# Patient Record
Sex: Female | Born: 1950
Health system: Southern US, Community
[De-identification: ages and names within clinical notes are randomized; demographics above are authoritative.]

## PROBLEM LIST (undated history)

## (undated) DIAGNOSIS — J45909 Unspecified asthma, uncomplicated: Secondary | ICD-10-CM

## (undated) DIAGNOSIS — E7800 Pure hypercholesterolemia, unspecified: Secondary | ICD-10-CM

## (undated) HISTORY — PX: ABDOMINAL HYSTERECTOMY: SHX81

---

## 2015-01-14 DIAGNOSIS — J9801 Acute bronchospasm: Secondary | ICD-10-CM | POA: Diagnosis not present

## 2015-01-25 DIAGNOSIS — R05 Cough: Secondary | ICD-10-CM | POA: Diagnosis not present

## 2015-01-25 DIAGNOSIS — J4521 Mild intermittent asthma with (acute) exacerbation: Secondary | ICD-10-CM | POA: Diagnosis not present

## 2015-01-25 DIAGNOSIS — J310 Chronic rhinitis: Secondary | ICD-10-CM | POA: Diagnosis not present

## 2015-02-11 DIAGNOSIS — J9801 Acute bronchospasm: Secondary | ICD-10-CM | POA: Diagnosis not present

## 2015-02-15 DIAGNOSIS — J9801 Acute bronchospasm: Secondary | ICD-10-CM | POA: Diagnosis not present

## 2015-02-17 DIAGNOSIS — Z0000 Encounter for general adult medical examination without abnormal findings: Secondary | ICD-10-CM | POA: Diagnosis not present

## 2015-03-16 DIAGNOSIS — R509 Fever, unspecified: Secondary | ICD-10-CM | POA: Diagnosis not present

## 2015-03-16 DIAGNOSIS — R05 Cough: Secondary | ICD-10-CM | POA: Diagnosis not present

## 2015-03-16 DIAGNOSIS — J4520 Mild intermittent asthma, uncomplicated: Secondary | ICD-10-CM | POA: Diagnosis not present

## 2015-06-16 DIAGNOSIS — R739 Hyperglycemia, unspecified: Secondary | ICD-10-CM | POA: Diagnosis not present

## 2015-06-16 DIAGNOSIS — E785 Hyperlipidemia, unspecified: Secondary | ICD-10-CM | POA: Diagnosis not present

## 2015-07-01 ENCOUNTER — Encounter: Payer: Self-pay | Admitting: *Deleted

## 2015-07-01 ENCOUNTER — Emergency Department
Admission: EM | Admit: 2015-07-01 | Discharge: 2015-07-02 | Disposition: A | Discharge status: Home / Self Care | Payer: PPO | Attending: Emergency Medicine | Admitting: Emergency Medicine

## 2015-07-01 ENCOUNTER — Emergency Department: Payer: PPO

## 2015-07-01 DIAGNOSIS — R0602 Shortness of breath: Secondary | ICD-10-CM | POA: Diagnosis not present

## 2015-07-01 DIAGNOSIS — R9431 Abnormal electrocardiogram [ECG] [EKG]: Secondary | ICD-10-CM | POA: Diagnosis not present

## 2015-07-01 DIAGNOSIS — R062 Wheezing: Secondary | ICD-10-CM | POA: Insufficient documentation

## 2015-07-01 DIAGNOSIS — Z79899 Other long term (current) drug therapy: Secondary | ICD-10-CM | POA: Insufficient documentation

## 2015-07-01 HISTORY — DX: Pure hypercholesterolemia, unspecified: E78.00

## 2015-07-01 HISTORY — DX: Unspecified asthma, uncomplicated: J45.909

## 2015-07-01 MED ORDER — METHYLPREDNISOLONE SODIUM SUCC 125 MG IJ SOLR
125.0000 mg | Freq: Once | INTRAMUSCULAR | Status: AC
  Administered 2015-07-01: 125 mg via INTRAVENOUS
  Filled 2015-07-01: qty 2

## 2015-07-01 MED ORDER — IPRATROPIUM-ALBUTEROL 0.5-2.5 (3) MG/3ML IN SOLN
3.0000 mL | Freq: Once | RESPIRATORY_TRACT | Status: AC
  Administered 2015-07-01: 3 mL via RESPIRATORY_TRACT
  Filled 2015-07-01: qty 3

## 2015-07-01 MED ORDER — PREDNISONE 20 MG PO TABS: 40.0000 mg | ORAL_TABLET | Freq: Every day | ORAL | Status: AC

## 2015-07-01 NOTE — ED Notes (Signed)
Pt presents w/ dyspnea, audible inspiratory and expiratory wheezing, substernal retractions and persistent, non-productive cough. Pt last used neb x 3 hrs ago w/ small relief.

## 2015-07-01 NOTE — Discharge Instructions (Signed)
Please seek medical attention for any high fevers, chest pain, shortness of breath, change in behavior, persistent vomiting, bloody stool or any other new or concerning symptoms.  Bronchospasm, Adult A bronchospasm is when the tubes that carry air in and out of your lungs (airways) spasm or tighten. During a bronchospasm it is hard to breathe. This is because the airways get smaller. A bronchospasm can be triggered by:  Allergies. These may be to animals, pollen, food, or mold.  Infection. This is a common cause of bronchospasm.  Exercise.  Irritants. These include pollution, cigarette smoke, strong odors, aerosol sprays, and paint fumes.  Weather changes.  Stress.  Being emotional. HOME CARE   Always have a plan for getting help. Know when to call your doctor and local emergency services (911 in the [place] Know where you can get emergency care.  Only take medicines as told by your doctor.  If you were prescribed an inhaler or nebulizer machine, ask your doctor how to use it correctly. Always use a spacer with your inhaler if you were given one.  Stay calm during an attack. Try to relax and breathe more slowly.  Control your home environment:  Change your heating and air conditioning filter at least once a month.  Limit your use of fireplaces and wood stoves.  Do not  smoke. Do not  allow smoking in your home.  Avoid perfumes and fragrances.  Get rid of pests (such as roaches and mice) and their droppings.  Throw away plants if you see mold on them.  Keep your house clean and dust free.  Replace carpet with wood, tile, or vinyl flooring. Carpet can trap dander and dust.  Use allergy-proof pillows, mattress covers, and box spring covers.  Wash bed sheets and blankets every week in hot water. Dry them in a dryer.  Use blankets that are made of polyester or cotton.  Wash hands frequently. GET HELP IF:  You have muscle aches.  You have chest pain.  The thick  spit you spit or cough up (sputum) changes from clear or white to yellow, green, gray, or bloody.  The thick spit you spit or cough up gets thicker.  There are problems that may be related to the medicine you are given such as:  A rash.  Itching.  Swelling.  Trouble breathing. GET HELP RIGHT AWAY IF:  You feel you cannot breathe or catch your breath.  You cannot stop coughing.  Your treatment is not helping you breathe better.  You have very bad chest pain. MAKE SURE YOU:   Understand these instructions.  Will watch your condition.  Will get help right away if you are not doing well or get worse.   This information is not intended to replace advice given to you by your health care provider. Make sure you discuss any questions you have with your health care provider.   Document Released: 10/15/2008 Document Revised: 01/08/2014 Document Reviewed: 06/10/2012 Elsevier Interactive Patient Education [employer]2016 [employer]

## 2015-07-02 NOTE — ED Provider Notes (Addendum)
[facility] Emergency Department Provider Note    ____________________________________________  Time seen: ~2225  I have reviewed the triage vital signs and the nursing notes.   HISTORY  Chief Complaint Wheezing   History limited by: Not Limited   HPI Dawn Soto is a 65 y.o. female who presents to the emergency department today because of concerns for shortness of breath and wheezing. Patient states she has a history of reactive bronchospasm. She says that this typically occurs in the cold. She does have albuterol home which she has used without much relief today. She says she does see Dr. Meredeth IdeFleming pulmonology. She is unsure what triggered the attack today. It started earlier in the day and has been persistent. She denies any associated chest pain. No cough. No fevers.    Past Medical History  Diagnosis Date  . Asthma   . Hypercholesteremia     There are no active problems to display for this patient.   Past Surgical History  Procedure Laterality Date  . Abdominal hysterectomy      Current Outpatient Rx  Name  Route  Sig  Dispense  Refill  . albuterol (PROVENTIL HFA;VENTOLIN HFA) 108 (90 Base) MCG/ACT inhaler   Inhalation   Inhale into the lungs every 6 (six) hours as needed for wheezing or shortness of breath.         Marland Kitchen. albuterol (PROVENTIL) (2.5 MG/3ML) 0.083% nebulizer solution   Nebulization   Take 2.5 mg by nebulization every 6 (six) hours as needed for wheezing or shortness of breath.         . budesonide-formoterol (SYMBICORT) 80-4.5 MCG/ACT inhaler   Inhalation   Inhale 2 puffs into the lungs 2 (two) times daily.         . cetirizine (ZYRTEC) 10 MG chewable tablet   Oral   Chew 10 mg by mouth daily.         . simvastatin (ZOCOR) 20 MG tablet   Oral   Take 20 mg by mouth daily.         . predniSONE (DELTASONE) 20 MG tablet   Oral   Take 2 tablets (40 mg total) by mouth daily.   8 tablet   0      Allergies Review of patient's allergies indicates no known allergies.  History reviewed. No pertinent family history.  Social History Social History  Substance Use Topics  . Smoking status: Never Smoker   . Smokeless tobacco: Never Used  . Alcohol Use: No    Review of Systems  Constitutional: Negative for fever. Cardiovascular: Negative for chest pain. Respiratory: Positive for shortness of breath. Gastrointestinal: Negative for abdominal pain, vomiting and diarrhea. Neurological: Negative for headaches, focal weakness or numbness.  10-point ROS otherwise negative.  ____________________________________________   PHYSICAL EXAM:  VITAL SIGNS: ED Triage Vitals  Enc Vitals Group     BP 07/01/15 2205 140/77 mmHg     Pulse Rate 07/01/15 2205 85     Resp 07/01/15 2205 22     Temp 07/01/15 2205 98.4 F (36.9 C)     Temp Source 07/01/15 2205 Oral     SpO2 07/01/15 2205 95 %     Weight 07/01/15 2205 155 lb (70.308 kg)     Height 07/01/15 2205 5\' 5"  (1.651 m)     Head Cir --      Peak Flow --      Pain Score 07/01/15 2233 0   Constitutional: Alert and oriented. Well appearing and in  no distress. Eyes: Conjunctivae are normal. PERRL. Normal extraocular movements. ENT   Head: Normocephalic and atraumatic.   Nose: No congestion/rhinnorhea.   Mouth/Throat: Mucous membranes are moist.   Neck: No stridor. Hematological/Lymphatic/Immunilogical: No cervical lymphadenopathy. Cardiovascular: Normal rate, regular rhythm.  No murmurs, rubs, or gallops. Respiratory: Slightly increased respiratory effort. Diffuse expiratory wheezing. No stridor. No crackles or rhonchi. Gastrointestinal: Soft and nontender. No distention. Genitourinary: Deferred Musculoskeletal: Normal range of motion in all extremities. No joint effusions.  No lower extremity tenderness nor edema. Neurologic:  Normal speech and language. No gross focal neurologic deficits are appreciated.  Skin:   Skin is warm, dry and intact. No rash noted. Psychiatric: Mood and affect are normal. Speech and behavior are normal. Patient exhibits appropriate insight and judgment.  ____________________________________________    LABS (pertinent positives/negatives)  None  ____________________________________________   EKG  I, Phineas SemenGraydon Chimere Klingensmith, attending physician, personally viewed and interpreted this EKG  EKG Time: 2220 Rate: 78 Rhythm: normal sinus rhythm Axis: right axis deviation Intervals: qtc 459 QRS: narrow ST changes: no st elevation Impression: abnormal ekg  ____________________________________________    RADIOLOGY  CXR IMPRESSION: No active cardiopulmonary disease.   ___________________________________________   PROCEDURES  Procedure(s) performed: None  Critical Care performed: No  ____________________________________________   INITIAL IMPRESSION / ASSESSMENT AND PLAN / ED COURSE  Pertinent labs & imaging results that were available during my care of the patient were reviewed by me and considered in my medical decision making (see chart for details).  Patient presented to the emergency department today because of concerns for shortness breath and wheezing. Patient has a history of reactive bronchospasm. On initial exam patient had diffuse respiratory wheezing. She was given Solu-Medrol and DuoNeb. On reexam the wheezing had greatly improved. She did still have some mild wheezing especially in the bases. I did discuss with the patient that we can give further DuoNeb treatments however she stated she felt comfortable trying to manage this at home at this point. Will plan given patient prescription for steroids and having her follow up with Dr. Meredeth IdeFleming.  ____________________________________________   FINAL CLINICAL IMPRESSION(S) / ED DIAGNOSES  Final diagnoses:  Wheezing     Note: This dictation was prepared with Dragon dictation. Any transcriptional  errors that result from this process are unintentional    Phineas SemenGraydon Tyresse Jayson, MD 07/02/15 0040  Phineas SemenGraydon Lake Cinquemani, MD 07/19/15 2243

## 2015-07-20 DIAGNOSIS — J9801 Acute bronchospasm: Secondary | ICD-10-CM | POA: Diagnosis not present

## 2015-07-20 DIAGNOSIS — L659 Nonscarring hair loss, unspecified: Secondary | ICD-10-CM | POA: Diagnosis not present

## 2017-02-18 DIAGNOSIS — R05 Cough: Secondary | ICD-10-CM | POA: Diagnosis not present

## 2017-03-20 DIAGNOSIS — Z79899 Other long term (current) drug therapy: Secondary | ICD-10-CM | POA: Diagnosis not present

## 2017-03-27 DIAGNOSIS — R739 Hyperglycemia, unspecified: Secondary | ICD-10-CM | POA: Diagnosis not present

## 2017-03-27 DIAGNOSIS — Z0000 Encounter for general adult medical examination without abnormal findings: Secondary | ICD-10-CM | POA: Diagnosis not present

## 2017-03-27 DIAGNOSIS — Z79899 Other long term (current) drug therapy: Secondary | ICD-10-CM | POA: Diagnosis not present

## 2017-03-27 DIAGNOSIS — E782 Mixed hyperlipidemia: Secondary | ICD-10-CM | POA: Diagnosis not present

## 2017-03-27 DIAGNOSIS — E538 Deficiency of other specified B group vitamins: Secondary | ICD-10-CM | POA: Diagnosis not present

## 2017-04-02 DIAGNOSIS — J40 Bronchitis, not specified as acute or chronic: Secondary | ICD-10-CM | POA: Diagnosis not present

## 2017-04-02 DIAGNOSIS — J45902 Unspecified asthma with status asthmaticus: Secondary | ICD-10-CM | POA: Diagnosis not present

## 2017-04-22 DIAGNOSIS — M542 Cervicalgia: Secondary | ICD-10-CM | POA: Diagnosis not present

## 2017-04-22 DIAGNOSIS — G44229 Chronic tension-type headache, not intractable: Secondary | ICD-10-CM | POA: Diagnosis not present

## 2017-06-17 DIAGNOSIS — R6889 Other general symptoms and signs: Secondary | ICD-10-CM | POA: Diagnosis not present

## 2017-07-01 DIAGNOSIS — R6889 Other general symptoms and signs: Secondary | ICD-10-CM | POA: Diagnosis not present

## 2017-07-01 DIAGNOSIS — H8110 Benign paroxysmal vertigo, unspecified ear: Secondary | ICD-10-CM | POA: Diagnosis not present

## 2017-07-09 DIAGNOSIS — R6889 Other general symptoms and signs: Secondary | ICD-10-CM | POA: Diagnosis not present

## 2017-07-09 DIAGNOSIS — R42 Dizziness and giddiness: Secondary | ICD-10-CM | POA: Diagnosis not present

## 2017-07-09 DIAGNOSIS — H8110 Benign paroxysmal vertigo, unspecified ear: Secondary | ICD-10-CM | POA: Diagnosis not present

## 2017-07-12 DIAGNOSIS — R6889 Other general symptoms and signs: Secondary | ICD-10-CM | POA: Diagnosis not present

## 2017-07-12 DIAGNOSIS — R42 Dizziness and giddiness: Secondary | ICD-10-CM | POA: Diagnosis not present

## 2017-07-12 DIAGNOSIS — H8110 Benign paroxysmal vertigo, unspecified ear: Secondary | ICD-10-CM | POA: Diagnosis not present

## 2017-08-16 DIAGNOSIS — R05 Cough: Secondary | ICD-10-CM | POA: Diagnosis not present

## 2017-08-16 DIAGNOSIS — J45901 Unspecified asthma with (acute) exacerbation: Secondary | ICD-10-CM | POA: Diagnosis not present

## 2017-08-16 DIAGNOSIS — J301 Allergic rhinitis due to pollen: Secondary | ICD-10-CM | POA: Diagnosis not present

## 2017-08-22 DIAGNOSIS — R6889 Other general symptoms and signs: Secondary | ICD-10-CM | POA: Diagnosis not present

## 2017-08-22 DIAGNOSIS — J301 Allergic rhinitis due to pollen: Secondary | ICD-10-CM | POA: Diagnosis not present

## 2017-08-30 DIAGNOSIS — R05 Cough: Secondary | ICD-10-CM | POA: Diagnosis not present

## 2017-09-23 DIAGNOSIS — J209 Acute bronchitis, unspecified: Secondary | ICD-10-CM | POA: Diagnosis not present

## 2017-09-23 DIAGNOSIS — J45902 Unspecified asthma with status asthmaticus: Secondary | ICD-10-CM | POA: Diagnosis not present

## 2017-09-23 DIAGNOSIS — B9689 Other specified bacterial agents as the cause of diseases classified elsewhere: Secondary | ICD-10-CM | POA: Diagnosis not present

## 2017-09-23 DIAGNOSIS — J302 Other seasonal allergic rhinitis: Secondary | ICD-10-CM | POA: Diagnosis not present

## 2017-09-23 DIAGNOSIS — J019 Acute sinusitis, unspecified: Secondary | ICD-10-CM | POA: Diagnosis not present

## 2017-11-25 DIAGNOSIS — R062 Wheezing: Secondary | ICD-10-CM | POA: Diagnosis not present

## 2017-11-25 DIAGNOSIS — J209 Acute bronchitis, unspecified: Secondary | ICD-10-CM | POA: Diagnosis not present

## 2019-02-23 ENCOUNTER — Ambulatory Visit: Payer: PPO | Attending: Internal Medicine

## 2019-02-23 DIAGNOSIS — Z23 Encounter for immunization: Secondary | ICD-10-CM | POA: Insufficient documentation

## 2019-02-23 NOTE — Progress Notes (Signed)
   Covid-19 Vaccination Clinic  Name:  Dawn Soto    MRN: [id number] DOB: 07/03/1950  02/23/2019  Dawn Soto was observed post Covid-19 immunization for 15 minutes without incidence. She was provided with Vaccine Information Sheet and instruction to access the V-Safe system.   Dawn Soto was instructed to call 911 with any severe reactions post vaccine: Marland Kitchen Difficulty breathing  . Swelling of your face and throat  . A fast heartbeat  . A bad rash all over your body  . Dizziness and weakness    Immunizations Administered    Name Date Dose VIS Date Route   Moderna COVID-19 Vaccine 02/23/2019  2:33 PM 0.5 mL 12/02/2018 Intramuscular   Manufacturer: Moderna   Lot: [id number]   NDC: 32919-166-06

## 2019-03-21 DIAGNOSIS — Z20822 Contact with and (suspected) exposure to covid-19: Secondary | ICD-10-CM | POA: Diagnosis not present

## 2019-03-21 DIAGNOSIS — R05 Cough: Secondary | ICD-10-CM | POA: Diagnosis not present

## 2019-03-21 DIAGNOSIS — J4531 Mild persistent asthma with (acute) exacerbation: Secondary | ICD-10-CM | POA: Diagnosis not present

## 2019-03-24 ENCOUNTER — Ambulatory Visit: Payer: PPO | Attending: Internal Medicine

## 2019-03-24 DIAGNOSIS — Z23 Encounter for immunization: Secondary | ICD-10-CM

## 2019-03-24 NOTE — Progress Notes (Signed)
   Covid-19 Vaccination Clinic  Name:  Dawn Soto    MRN: [id number] DOB: [birth year]  03/24/2019  Ms. Kawai was observed post Covid-19 immunization for 15 minutes without incident. She was provided with Vaccine Information Sheet and instruction to access the V-Safe system.   Ms. Hur was instructed to call 911 with any severe reactions post vaccine: Marland Kitchen Difficulty breathing  . Swelling of face and throat  . A fast heartbeat  . A bad rash all over body  . Dizziness and weakness   Immunizations Administered    Name Date Dose VIS Date Route   Moderna COVID-19 Vaccine 03/24/2019  2:05 PM 0.5 mL 12/02/2018 Intramuscular   Manufacturer: Gala Murdoch   Lot: [id number]   NDC: 46286-381-77

## 2019-05-01 DIAGNOSIS — S56911A Strain of unspecified muscles, fascia and tendons at forearm level, right arm, initial encounter: Secondary | ICD-10-CM | POA: Diagnosis not present

## 2019-05-14 ENCOUNTER — Ambulatory Visit
Admission: RE | Admit: 2019-05-14 | Discharge: 2019-05-14 | Disposition: A | Discharge status: Home / Self Care | Payer: PPO | Source: Ambulatory Visit | Attending: Student | Admitting: Student

## 2019-05-14 ENCOUNTER — Other Ambulatory Visit: Payer: Self-pay

## 2019-05-14 ENCOUNTER — Other Ambulatory Visit: Payer: Self-pay | Admitting: Student

## 2019-05-14 DIAGNOSIS — M79601 Pain in right arm: Secondary | ICD-10-CM

## 2019-05-14 DIAGNOSIS — M25521 Pain in right elbow: Secondary | ICD-10-CM | POA: Diagnosis not present

## 2019-05-14 DIAGNOSIS — R062 Wheezing: Secondary | ICD-10-CM | POA: Diagnosis not present

## 2019-05-14 DIAGNOSIS — S46911D Strain of unspecified muscle, fascia and tendon at shoulder and upper arm level, right arm, subsequent encounter: Secondary | ICD-10-CM | POA: Diagnosis not present

## 2019-05-14 DIAGNOSIS — M7989 Other specified soft tissue disorders: Secondary | ICD-10-CM | POA: Diagnosis not present

## 2019-06-15 DIAGNOSIS — J4541 Moderate persistent asthma with (acute) exacerbation: Secondary | ICD-10-CM | POA: Diagnosis not present

## 2019-06-15 DIAGNOSIS — R062 Wheezing: Secondary | ICD-10-CM | POA: Diagnosis not present

## 2019-07-08 DIAGNOSIS — E782 Mixed hyperlipidemia: Secondary | ICD-10-CM | POA: Diagnosis not present

## 2019-07-08 DIAGNOSIS — R739 Hyperglycemia, unspecified: Secondary | ICD-10-CM | POA: Diagnosis not present

## 2019-07-14 DIAGNOSIS — E042 Nontoxic multinodular goiter: Secondary | ICD-10-CM | POA: Diagnosis not present

## 2019-07-14 DIAGNOSIS — R739 Hyperglycemia, unspecified: Secondary | ICD-10-CM | POA: Diagnosis not present

## 2019-07-14 DIAGNOSIS — Z0000 Encounter for general adult medical examination without abnormal findings: Secondary | ICD-10-CM | POA: Diagnosis not present

## 2019-07-14 DIAGNOSIS — E782 Mixed hyperlipidemia: Secondary | ICD-10-CM | POA: Diagnosis not present

## 2019-09-29 DIAGNOSIS — E042 Nontoxic multinodular goiter: Secondary | ICD-10-CM | POA: Diagnosis not present

## 2019-11-19 DIAGNOSIS — E042 Nontoxic multinodular goiter: Secondary | ICD-10-CM | POA: Diagnosis not present

## 2019-12-16 DIAGNOSIS — E042 Nontoxic multinodular goiter: Secondary | ICD-10-CM | POA: Diagnosis not present

## 2019-12-17 DIAGNOSIS — E042 Nontoxic multinodular goiter: Secondary | ICD-10-CM | POA: Diagnosis not present

## 2020-03-02 DIAGNOSIS — E782 Mixed hyperlipidemia: Secondary | ICD-10-CM | POA: Diagnosis not present

## 2020-03-02 DIAGNOSIS — R739 Hyperglycemia, unspecified: Secondary | ICD-10-CM | POA: Diagnosis not present

## 2020-03-03 DIAGNOSIS — J4540 Moderate persistent asthma, uncomplicated: Secondary | ICD-10-CM | POA: Diagnosis not present

## 2020-03-08 DIAGNOSIS — J45909 Unspecified asthma, uncomplicated: Secondary | ICD-10-CM | POA: Diagnosis not present

## 2020-03-08 DIAGNOSIS — F5104 Psychophysiologic insomnia: Secondary | ICD-10-CM | POA: Diagnosis not present

## 2020-03-22 DIAGNOSIS — Z0100 Encounter for examination of eyes and vision without abnormal findings: Secondary | ICD-10-CM | POA: Diagnosis not present

## 2020-03-22 DIAGNOSIS — H524 Presbyopia: Secondary | ICD-10-CM | POA: Diagnosis not present

## 2020-03-23 DIAGNOSIS — E042 Nontoxic multinodular goiter: Secondary | ICD-10-CM | POA: Diagnosis not present

## 2020-03-24 DIAGNOSIS — E042 Nontoxic multinodular goiter: Secondary | ICD-10-CM | POA: Diagnosis not present

## 2020-04-05 DIAGNOSIS — J4540 Moderate persistent asthma, uncomplicated: Secondary | ICD-10-CM | POA: Diagnosis not present

## 2020-04-05 DIAGNOSIS — Z01818 Encounter for other preprocedural examination: Secondary | ICD-10-CM | POA: Diagnosis not present

## 2020-06-03 DIAGNOSIS — M7711 Lateral epicondylitis, right elbow: Secondary | ICD-10-CM | POA: Diagnosis not present

## 2020-06-10 DIAGNOSIS — J45909 Unspecified asthma, uncomplicated: Secondary | ICD-10-CM | POA: Diagnosis not present

## 2020-06-10 DIAGNOSIS — M779 Enthesopathy, unspecified: Secondary | ICD-10-CM | POA: Diagnosis not present

## 2020-07-02 DIAGNOSIS — L509 Urticaria, unspecified: Secondary | ICD-10-CM | POA: Diagnosis not present

## 2020-09-27 DIAGNOSIS — E782 Mixed hyperlipidemia: Secondary | ICD-10-CM | POA: Diagnosis not present

## 2020-09-27 DIAGNOSIS — R739 Hyperglycemia, unspecified: Secondary | ICD-10-CM | POA: Diagnosis not present

## 2020-10-04 DIAGNOSIS — Z1389 Encounter for screening for other disorder: Secondary | ICD-10-CM | POA: Diagnosis not present

## 2020-10-04 DIAGNOSIS — E785 Hyperlipidemia, unspecified: Secondary | ICD-10-CM | POA: Diagnosis not present

## 2020-10-04 DIAGNOSIS — Z0000 Encounter for general adult medical examination without abnormal findings: Secondary | ICD-10-CM | POA: Diagnosis not present

## 2020-10-04 DIAGNOSIS — Z780 Asymptomatic menopausal state: Secondary | ICD-10-CM | POA: Diagnosis not present

## 2020-10-04 DIAGNOSIS — R739 Hyperglycemia, unspecified: Secondary | ICD-10-CM | POA: Diagnosis not present

## 2020-10-06 ENCOUNTER — Other Ambulatory Visit: Payer: Self-pay | Admitting: Internal Medicine

## 2020-10-06 DIAGNOSIS — Z1231 Encounter for screening mammogram for malignant neoplasm of breast: Secondary | ICD-10-CM

## 2020-10-17 DIAGNOSIS — J4540 Moderate persistent asthma, uncomplicated: Secondary | ICD-10-CM | POA: Diagnosis not present

## 2020-10-25 ENCOUNTER — Other Ambulatory Visit: Payer: Self-pay

## 2020-10-25 ENCOUNTER — Ambulatory Visit
Admission: RE | Admit: 2020-10-25 | Discharge: 2020-10-25 | Disposition: A | Discharge status: Home / Self Care | Payer: Medicare HMO | Source: Ambulatory Visit | Attending: Internal Medicine | Admitting: Internal Medicine

## 2020-10-25 DIAGNOSIS — Z1231 Encounter for screening mammogram for malignant neoplasm of breast: Secondary | ICD-10-CM | POA: Diagnosis not present

## 2020-11-14 DIAGNOSIS — Z780 Asymptomatic menopausal state: Secondary | ICD-10-CM | POA: Diagnosis not present

## 2020-11-14 DIAGNOSIS — Z13820 Encounter for screening for osteoporosis: Secondary | ICD-10-CM | POA: Diagnosis not present

## 2021-03-22 DIAGNOSIS — J4540 Moderate persistent asthma, uncomplicated: Secondary | ICD-10-CM | POA: Diagnosis not present

## 2021-03-22 DIAGNOSIS — I517 Cardiomegaly: Secondary | ICD-10-CM | POA: Diagnosis not present

## 2021-03-22 DIAGNOSIS — J45909 Unspecified asthma, uncomplicated: Secondary | ICD-10-CM | POA: Diagnosis not present

## 2021-04-28 DIAGNOSIS — E782 Mixed hyperlipidemia: Secondary | ICD-10-CM | POA: Diagnosis not present

## 2021-04-28 DIAGNOSIS — R739 Hyperglycemia, unspecified: Secondary | ICD-10-CM | POA: Diagnosis not present

## 2021-05-05 DIAGNOSIS — E785 Hyperlipidemia, unspecified: Secondary | ICD-10-CM | POA: Diagnosis not present

## 2021-05-05 DIAGNOSIS — R739 Hyperglycemia, unspecified: Secondary | ICD-10-CM | POA: Diagnosis not present

## 2021-05-05 DIAGNOSIS — J45909 Unspecified asthma, uncomplicated: Secondary | ICD-10-CM | POA: Diagnosis not present

## 2021-05-16 DIAGNOSIS — E042 Nontoxic multinodular goiter: Secondary | ICD-10-CM | POA: Diagnosis not present

## 2021-05-20 DIAGNOSIS — R221 Localized swelling, mass and lump, neck: Secondary | ICD-10-CM | POA: Diagnosis not present

## 2021-07-24 DIAGNOSIS — Z20822 Contact with and (suspected) exposure to covid-19: Secondary | ICD-10-CM | POA: Diagnosis not present

## 2021-07-24 DIAGNOSIS — Z7951 Long term (current) use of inhaled steroids: Secondary | ICD-10-CM | POA: Diagnosis not present

## 2021-07-24 DIAGNOSIS — N3000 Acute cystitis without hematuria: Secondary | ICD-10-CM | POA: Diagnosis not present

## 2021-07-24 DIAGNOSIS — R0602 Shortness of breath: Secondary | ICD-10-CM | POA: Diagnosis not present

## 2021-07-24 DIAGNOSIS — Z79899 Other long term (current) drug therapy: Secondary | ICD-10-CM | POA: Diagnosis not present

## 2021-07-24 DIAGNOSIS — R062 Wheezing: Secondary | ICD-10-CM | POA: Diagnosis not present

## 2021-07-24 DIAGNOSIS — J4521 Mild intermittent asthma with (acute) exacerbation: Secondary | ICD-10-CM | POA: Diagnosis not present

## 2021-07-24 DIAGNOSIS — E785 Hyperlipidemia, unspecified: Secondary | ICD-10-CM | POA: Diagnosis not present

## 2021-07-24 DIAGNOSIS — J988 Other specified respiratory disorders: Secondary | ICD-10-CM | POA: Diagnosis not present

## 2021-07-24 DIAGNOSIS — Z90710 Acquired absence of both cervix and uterus: Secondary | ICD-10-CM | POA: Diagnosis not present

## 2021-09-15 ENCOUNTER — Other Ambulatory Visit: Payer: Self-pay | Admitting: Internal Medicine

## 2021-09-15 DIAGNOSIS — Z1231 Encounter for screening mammogram for malignant neoplasm of breast: Secondary | ICD-10-CM

## 2021-09-27 DIAGNOSIS — Z0000 Encounter for general adult medical examination without abnormal findings: Secondary | ICD-10-CM | POA: Diagnosis not present

## 2021-09-27 DIAGNOSIS — J4540 Moderate persistent asthma, uncomplicated: Secondary | ICD-10-CM | POA: Diagnosis not present

## 2021-09-27 DIAGNOSIS — J441 Chronic obstructive pulmonary disease with (acute) exacerbation: Secondary | ICD-10-CM | POA: Diagnosis not present

## 2021-09-28 DIAGNOSIS — J4540 Moderate persistent asthma, uncomplicated: Secondary | ICD-10-CM | POA: Diagnosis not present

## 2021-09-28 DIAGNOSIS — Z7952 Long term (current) use of systemic steroids: Secondary | ICD-10-CM | POA: Diagnosis not present

## 2023-02-06 IMAGING — MG MM DIGITAL SCREENING BILAT W/ TOMO AND CAD
6 of 10 series · 6 of 30 positions shown · non-contrast
Comparison: Previous exam(s).

CLINICAL DATA: Screening.

EXAM:
DIGITAL SCREENING BILATERAL MAMMOGRAM WITH TOMOSYNTHESIS AND CAD
TECHNIQUE: Bilateral screening digital craniocaudal and mediolateral oblique
mammograms were obtained. Bilateral screening digital breast
tomosynthesis was performed. The images were evaluated with
computer-aided detection.

[R CC synth-2D]
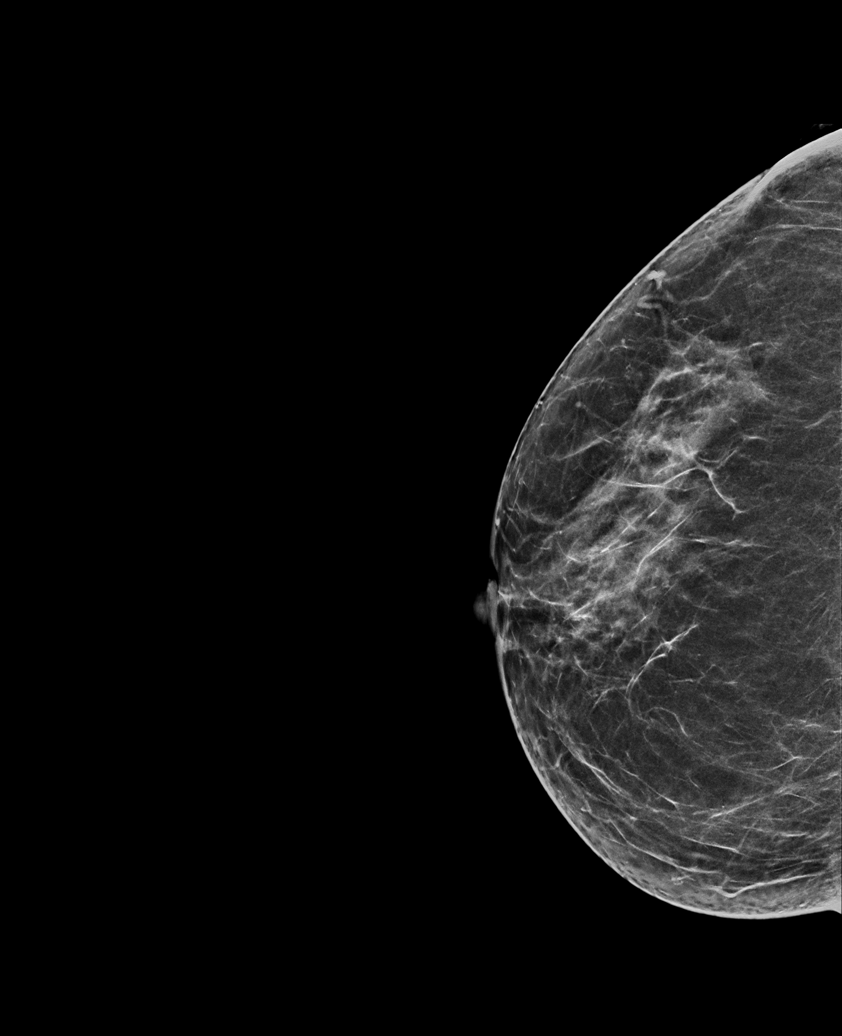

[L CC synth-2D]
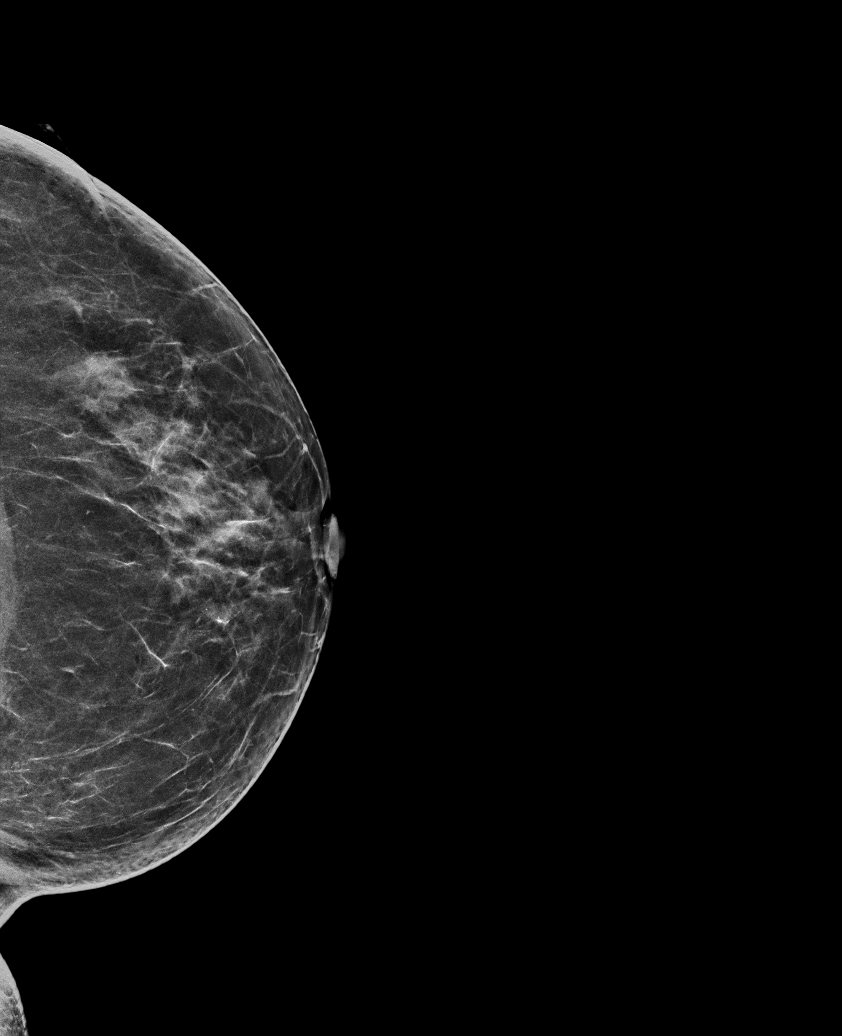

[R MLO synth-2D (1 of 2)]
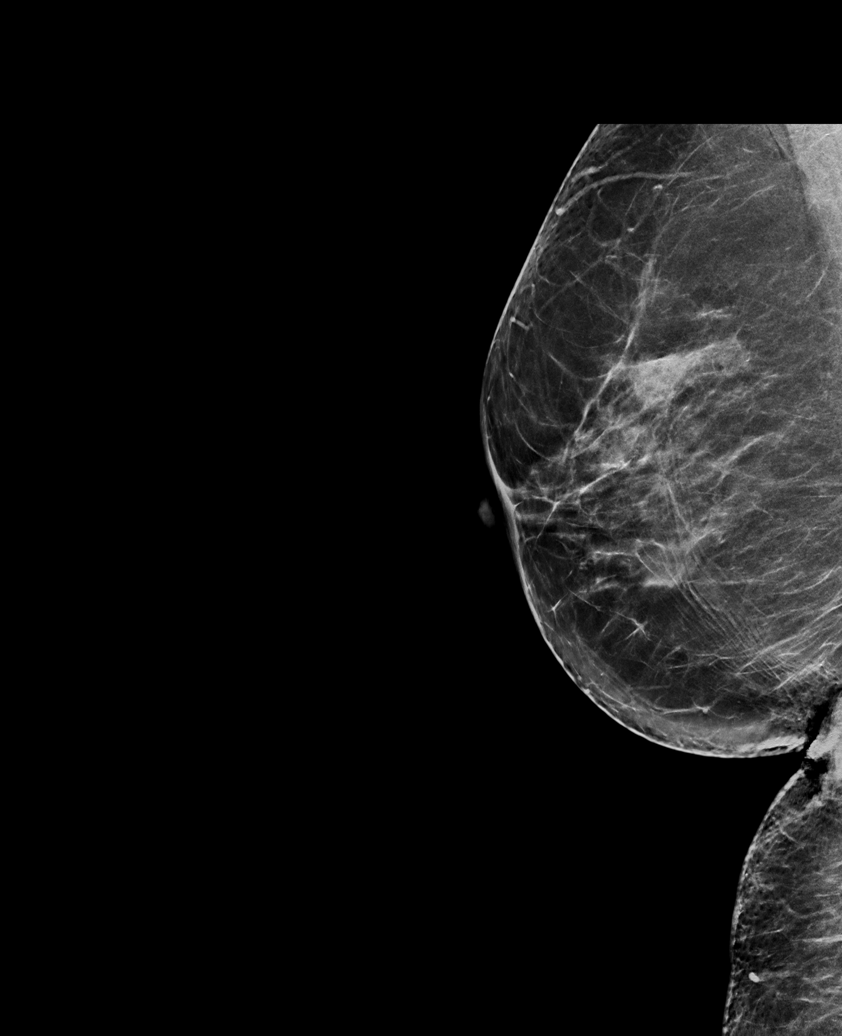

[L MLO synth-2D]
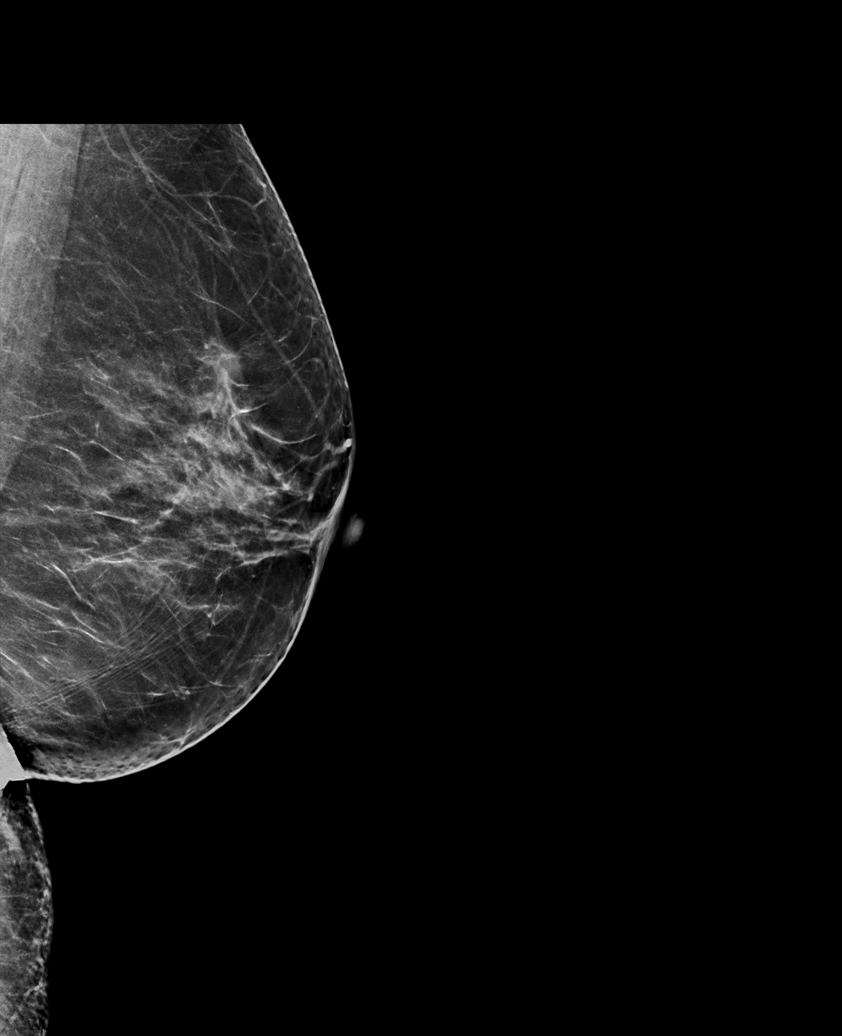

[R MLO synth-2D (2 of 2)]
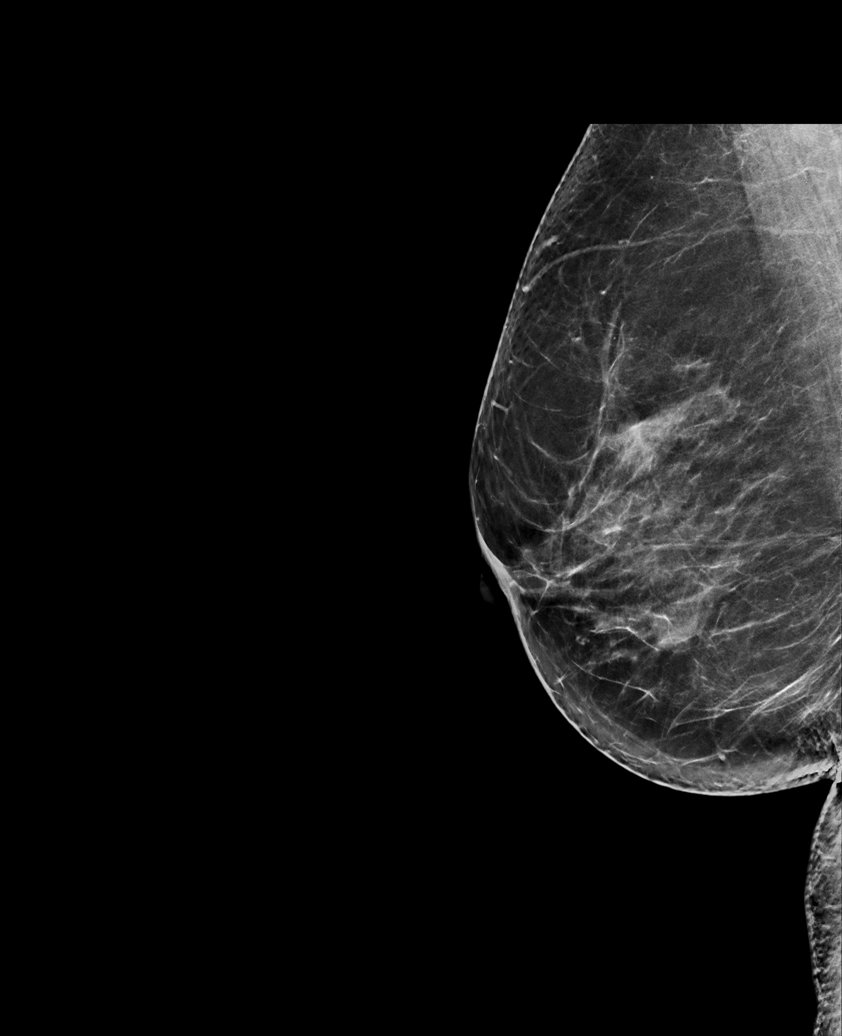

[L CC tomo · tomo slice 31/61.0]
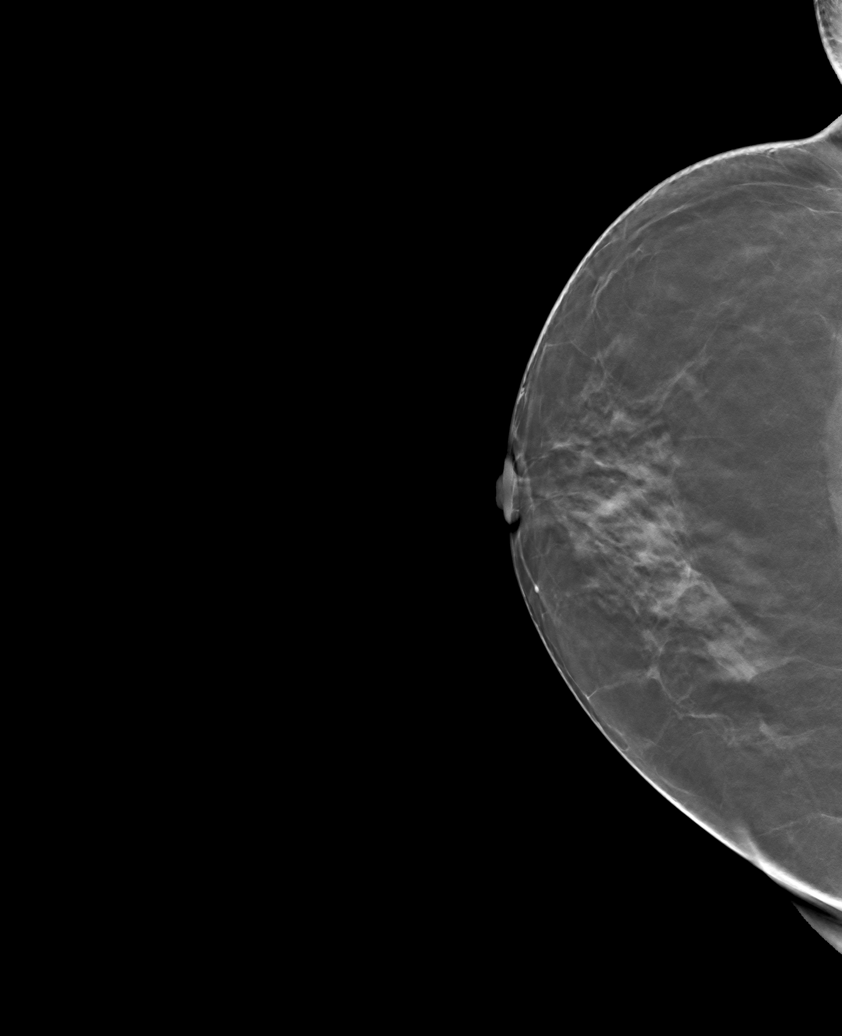

[6 of 30 positions shown; findings below may reference images not displayed]

ACR Breast Density Category b: There are scattered areas of
fibroglandular density.
FINDINGS: There are no findings suspicious for malignancy.
IMPRESSION: No mammographic evidence of malignancy. A result letter of this
screening mammogram will be mailed directly to the patient.

RECOMMENDATION:
Screening mammogram in one year. (Code:51-O-LD2)

BI-RADS CATEGORY  1: Negative.
